# Patient Record
Sex: Female | Born: 1955 | Hispanic: No | Marital: Married | Smoking: Never smoker
Health system: Southern US, Community
[De-identification: ages and names within clinical notes are randomized; demographics above are authoritative.]

## PROBLEM LIST (undated history)

## (undated) DIAGNOSIS — J309 Allergic rhinitis, unspecified: Secondary | ICD-10-CM

## (undated) DIAGNOSIS — H16131 Photokeratitis, right eye: Secondary | ICD-10-CM

## (undated) DIAGNOSIS — E785 Hyperlipidemia, unspecified: Secondary | ICD-10-CM

## (undated) DIAGNOSIS — E669 Obesity, unspecified: Secondary | ICD-10-CM

## (undated) DIAGNOSIS — D126 Benign neoplasm of colon, unspecified: Secondary | ICD-10-CM

## (undated) DIAGNOSIS — Z6832 Body mass index (BMI) 32.0-32.9, adult: Secondary | ICD-10-CM

## (undated) DIAGNOSIS — M85851 Other specified disorders of bone density and structure, right thigh: Secondary | ICD-10-CM

## (undated) HISTORY — DX: Hyperlipidemia, unspecified: E78.5

## (undated) HISTORY — DX: Benign neoplasm of colon, unspecified: D12.6

## (undated) HISTORY — DX: Allergic rhinitis, unspecified: J30.9

## (undated) HISTORY — DX: Other specified disorders of bone density and structure, right thigh: M85.851

## (undated) HISTORY — PX: ABDOMINAL HYSTERECTOMY: SHX81

## (undated) HISTORY — DX: Obesity, unspecified: E66.9

## (undated) HISTORY — DX: Photokeratitis, right eye: H16.131

## (undated) HISTORY — DX: Body mass index (BMI) 32.0-32.9, adult: Z68.32

## (undated) HISTORY — PX: TONSILLECTOMY: SUR1361

---

## 1998-05-29 ENCOUNTER — Other Ambulatory Visit: Admission: RE | Admit: 1998-05-29 | Discharge: 1998-05-29 | Payer: Self-pay | Admitting: Gynecology

## 1999-06-13 ENCOUNTER — Other Ambulatory Visit: Admission: RE | Admit: 1999-06-13 | Discharge: 1999-06-13 | Payer: Self-pay | Admitting: Gynecology

## 2000-06-05 ENCOUNTER — Encounter: Payer: Self-pay | Admitting: Gynecology

## 2000-06-05 ENCOUNTER — Encounter: Admission: RE | Admit: 2000-06-05 | Discharge: 2000-06-05 | Payer: Self-pay | Admitting: Gynecology

## 2000-06-13 ENCOUNTER — Other Ambulatory Visit: Admission: RE | Admit: 2000-06-13 | Discharge: 2000-06-13 | Payer: Self-pay | Admitting: Gynecology

## 2001-06-08 ENCOUNTER — Encounter: Payer: Self-pay | Admitting: Gynecology

## 2001-06-08 ENCOUNTER — Encounter: Admission: RE | Admit: 2001-06-08 | Discharge: 2001-06-08 | Payer: Self-pay | Admitting: Gynecology

## 2001-07-06 ENCOUNTER — Other Ambulatory Visit: Admission: RE | Admit: 2001-07-06 | Discharge: 2001-07-06 | Payer: Self-pay | Admitting: Dermatology

## 2002-06-09 ENCOUNTER — Encounter: Admission: RE | Admit: 2002-06-09 | Discharge: 2002-06-09 | Payer: Self-pay | Admitting: Gynecology

## 2002-06-09 ENCOUNTER — Encounter: Payer: Self-pay | Admitting: Gynecology

## 2002-07-08 ENCOUNTER — Other Ambulatory Visit: Admission: RE | Admit: 2002-07-08 | Discharge: 2002-07-08 | Payer: Self-pay | Admitting: Gynecology

## 2003-07-06 ENCOUNTER — Encounter: Admission: RE | Admit: 2003-07-06 | Discharge: 2003-07-06 | Payer: Self-pay | Admitting: Gynecology

## 2003-07-06 ENCOUNTER — Encounter: Payer: Self-pay | Admitting: Gynecology

## 2003-07-13 ENCOUNTER — Other Ambulatory Visit: Admission: RE | Admit: 2003-07-13 | Discharge: 2003-07-13 | Payer: Self-pay | Admitting: Gynecology

## 2004-07-09 ENCOUNTER — Ambulatory Visit (HOSPITAL_COMMUNITY): Admission: RE | Admit: 2004-07-09 | Discharge: 2004-07-09 | Payer: Self-pay | Admitting: Gynecology

## 2004-07-16 ENCOUNTER — Other Ambulatory Visit: Admission: RE | Admit: 2004-07-16 | Discharge: 2004-07-16 | Payer: Self-pay | Admitting: Gynecology

## 2005-07-12 ENCOUNTER — Ambulatory Visit (HOSPITAL_COMMUNITY): Admission: RE | Admit: 2005-07-12 | Discharge: 2005-07-12 | Payer: Self-pay | Admitting: Gynecology

## 2005-07-18 ENCOUNTER — Other Ambulatory Visit: Admission: RE | Admit: 2005-07-18 | Discharge: 2005-07-18 | Payer: Self-pay | Admitting: Gynecology

## 2005-07-25 ENCOUNTER — Encounter: Admission: RE | Admit: 2005-07-25 | Discharge: 2005-07-25 | Payer: Self-pay | Admitting: Gynecology

## 2006-07-21 ENCOUNTER — Other Ambulatory Visit: Admission: RE | Admit: 2006-07-21 | Discharge: 2006-07-21 | Payer: Self-pay | Admitting: Gynecology

## 2006-07-28 ENCOUNTER — Ambulatory Visit (HOSPITAL_COMMUNITY): Admission: RE | Admit: 2006-07-28 | Discharge: 2006-07-28 | Payer: Self-pay | Admitting: Gynecology

## 2007-09-24 ENCOUNTER — Ambulatory Visit (HOSPITAL_COMMUNITY): Admission: RE | Admit: 2007-09-24 | Discharge: 2007-09-24 | Payer: Self-pay | Admitting: Gynecology

## 2008-11-15 ENCOUNTER — Ambulatory Visit (HOSPITAL_COMMUNITY): Admission: RE | Admit: 2008-11-15 | Discharge: 2008-11-15 | Payer: Self-pay | Admitting: Gynecology

## 2009-12-12 ENCOUNTER — Ambulatory Visit (HOSPITAL_COMMUNITY): Admission: RE | Admit: 2009-12-12 | Discharge: 2009-12-12 | Payer: Self-pay | Admitting: Gynecology

## 2010-10-06 ENCOUNTER — Encounter: Payer: Self-pay | Admitting: Gynecology

## 2011-01-04 ENCOUNTER — Other Ambulatory Visit (HOSPITAL_COMMUNITY): Payer: Self-pay | Admitting: Gynecology

## 2011-01-04 DIAGNOSIS — Z1231 Encounter for screening mammogram for malignant neoplasm of breast: Secondary | ICD-10-CM

## 2011-01-10 ENCOUNTER — Ambulatory Visit (HOSPITAL_COMMUNITY)
Admission: RE | Admit: 2011-01-10 | Discharge: 2011-01-10 | Disposition: A | Discharge status: Home / Self Care | Payer: BC Managed Care – PPO | Source: Ambulatory Visit | Attending: Gynecology | Admitting: Gynecology

## 2011-01-10 DIAGNOSIS — Z1231 Encounter for screening mammogram for malignant neoplasm of breast: Secondary | ICD-10-CM | POA: Insufficient documentation

## 2012-01-16 ENCOUNTER — Other Ambulatory Visit (HOSPITAL_COMMUNITY): Payer: Self-pay | Admitting: Gynecology

## 2012-01-16 DIAGNOSIS — Z1231 Encounter for screening mammogram for malignant neoplasm of breast: Secondary | ICD-10-CM

## 2012-02-04 ENCOUNTER — Ambulatory Visit (HOSPITAL_COMMUNITY)
Admission: RE | Admit: 2012-02-04 | Discharge: 2012-02-04 | Disposition: A | Discharge status: Home / Self Care | Payer: BC Managed Care – PPO | Source: Ambulatory Visit | Attending: Gynecology | Admitting: Gynecology

## 2012-02-04 DIAGNOSIS — Z1231 Encounter for screening mammogram for malignant neoplasm of breast: Secondary | ICD-10-CM | POA: Insufficient documentation

## 2013-01-28 ENCOUNTER — Other Ambulatory Visit (HOSPITAL_COMMUNITY): Payer: Self-pay | Admitting: Gynecology

## 2013-01-28 DIAGNOSIS — Z1231 Encounter for screening mammogram for malignant neoplasm of breast: Secondary | ICD-10-CM

## 2013-02-09 ENCOUNTER — Ambulatory Visit (HOSPITAL_COMMUNITY): Payer: BC Managed Care – PPO

## 2013-02-10 ENCOUNTER — Other Ambulatory Visit (HOSPITAL_COMMUNITY): Payer: Self-pay | Admitting: Family Medicine

## 2013-02-10 DIAGNOSIS — Z1231 Encounter for screening mammogram for malignant neoplasm of breast: Secondary | ICD-10-CM

## 2013-02-16 ENCOUNTER — Ambulatory Visit (HOSPITAL_COMMUNITY)
Admission: RE | Admit: 2013-02-16 | Discharge: 2013-02-16 | Disposition: A | Discharge status: Home / Self Care | Payer: BC Managed Care – PPO | Source: Ambulatory Visit | Attending: Family Medicine | Admitting: Family Medicine

## 2013-02-16 DIAGNOSIS — Z1231 Encounter for screening mammogram for malignant neoplasm of breast: Secondary | ICD-10-CM | POA: Insufficient documentation

## 2014-01-24 ENCOUNTER — Other Ambulatory Visit (HOSPITAL_COMMUNITY): Payer: Self-pay | Admitting: Family Medicine

## 2014-01-24 DIAGNOSIS — Z1231 Encounter for screening mammogram for malignant neoplasm of breast: Secondary | ICD-10-CM

## 2014-02-22 ENCOUNTER — Ambulatory Visit (HOSPITAL_COMMUNITY)
Admission: RE | Admit: 2014-02-22 | Discharge: 2014-02-22 | Disposition: A | Discharge status: Home / Self Care | Payer: BC Managed Care – PPO | Source: Ambulatory Visit | Attending: Family Medicine | Admitting: Family Medicine

## 2014-02-22 DIAGNOSIS — Z1231 Encounter for screening mammogram for malignant neoplasm of breast: Secondary | ICD-10-CM | POA: Insufficient documentation

## 2015-02-27 ENCOUNTER — Other Ambulatory Visit (HOSPITAL_COMMUNITY): Payer: Self-pay | Admitting: Family Medicine

## 2015-02-27 DIAGNOSIS — Z1231 Encounter for screening mammogram for malignant neoplasm of breast: Secondary | ICD-10-CM

## 2015-03-09 ENCOUNTER — Ambulatory Visit (HOSPITAL_COMMUNITY)
Admission: RE | Admit: 2015-03-09 | Discharge: 2015-03-09 | Disposition: A | Discharge status: Home / Self Care | Payer: PRIVATE HEALTH INSURANCE | Source: Ambulatory Visit | Attending: Family Medicine | Admitting: Family Medicine

## 2015-03-09 DIAGNOSIS — Z1231 Encounter for screening mammogram for malignant neoplasm of breast: Secondary | ICD-10-CM

## 2016-04-24 ENCOUNTER — Other Ambulatory Visit: Payer: Self-pay | Admitting: Family Medicine

## 2016-04-24 DIAGNOSIS — Z1231 Encounter for screening mammogram for malignant neoplasm of breast: Secondary | ICD-10-CM

## 2016-05-01 ENCOUNTER — Ambulatory Visit
Admission: RE | Admit: 2016-05-01 | Discharge: 2016-05-01 | Disposition: A | Discharge status: Home / Self Care | Payer: PRIVATE HEALTH INSURANCE | Source: Ambulatory Visit | Attending: Family Medicine | Admitting: Family Medicine

## 2016-05-01 DIAGNOSIS — Z1231 Encounter for screening mammogram for malignant neoplasm of breast: Secondary | ICD-10-CM

## 2017-05-06 ENCOUNTER — Other Ambulatory Visit: Payer: Self-pay | Admitting: Family Medicine

## 2017-05-06 DIAGNOSIS — Z1231 Encounter for screening mammogram for malignant neoplasm of breast: Secondary | ICD-10-CM

## 2017-05-16 ENCOUNTER — Ambulatory Visit
Admission: RE | Admit: 2017-05-16 | Discharge: 2017-05-16 | Disposition: A | Discharge status: Home / Self Care | Payer: PRIVATE HEALTH INSURANCE | Source: Ambulatory Visit | Attending: Family Medicine | Admitting: Family Medicine

## 2017-05-16 DIAGNOSIS — Z1231 Encounter for screening mammogram for malignant neoplasm of breast: Secondary | ICD-10-CM

## 2017-11-14 ENCOUNTER — Emergency Department (HOSPITAL_BASED_OUTPATIENT_CLINIC_OR_DEPARTMENT_OTHER)
Admission: EM | Admit: 2017-11-14 | Discharge: 2017-11-15 | Disposition: A | Discharge status: Home / Self Care | Payer: PRIVATE HEALTH INSURANCE | Attending: Emergency Medicine | Admitting: Emergency Medicine

## 2017-11-14 ENCOUNTER — Other Ambulatory Visit: Payer: Self-pay

## 2017-11-14 ENCOUNTER — Encounter (HOSPITAL_BASED_OUTPATIENT_CLINIC_OR_DEPARTMENT_OTHER): Payer: Self-pay | Admitting: Emergency Medicine

## 2017-11-14 DIAGNOSIS — R309 Painful micturition, unspecified: Secondary | ICD-10-CM | POA: Diagnosis not present

## 2017-11-14 DIAGNOSIS — R300 Dysuria: Secondary | ICD-10-CM | POA: Diagnosis not present

## 2017-11-14 LAB — URINALYSIS, MICROSCOPIC (REFLEX): WBC, UA: NONE SEEN WBC/hpf (ref 0–5)

## 2017-11-14 LAB — URINALYSIS, ROUTINE W REFLEX MICROSCOPIC
Bilirubin Urine: NEGATIVE
GLUCOSE, UA: NEGATIVE mg/dL
KETONES UR: NEGATIVE mg/dL
LEUKOCYTES UA: NEGATIVE
Nitrite: NEGATIVE
PH: 6 (ref 5.0–8.0)
Protein, ur: NEGATIVE mg/dL
Specific Gravity, Urine: 1.01 (ref 1.005–1.030)

## 2017-11-14 NOTE — ED Triage Notes (Signed)
Pt presents with c/o lower abdominal pain that started today

## 2017-11-14 NOTE — ED Provider Notes (Signed)
TIME SEEN: 11:40 PM  CHIEF COMPLAINT: Dysuria  HPI: Patient is a 62 year old female with previous history of urinary tract infection presents to the emergency department today with 1 day of dysuria and feeling a "pulling pain" when she is urinating.  No gross hematuria.  No flank pain.  No fevers, nausea, vomiting or diarrhea.  States this feels similar to her previous urinary tract infections.  She denies any vaginal bleeding or discharge.  ROS: See HPI Constitutional: no fever  Eyes: no drainage  ENT: no runny nose   Cardiovascular:  no chest pain  Resp: no SOB  GI: no vomiting GU:  dysuria Integumentary: no rash  Allergy: no hives  Musculoskeletal: no leg swelling  Neurological: no slurred speech ROS otherwise negative  PAST MEDICAL HISTORY/PAST SURGICAL HISTORY:  History reviewed. No pertinent past medical history.  MEDICATIONS:  Prior to Admission medications   Not on File    ALLERGIES:  No Known Allergies  SOCIAL HISTORY:  Social History   Tobacco Use  . Smoking status: Never Smoker  . Smokeless tobacco: Never Used  Substance Use Topics  . Alcohol use: No    Frequency: Never    FAMILY HISTORY: Family History  Problem Relation Age of Onset  . Breast cancer Sister [id number]    EXAM: BP 134/78 (BP Location: Left Arm)   Pulse 81   Temp 98 F (36.7 C) (Oral)   Resp 20   Ht 5\' 1"  (1.549 m)   Wt 60.8 kg (134 lb)   SpO2 99%   BMI 25.32 kg/m  CONSTITUTIONAL: Alert and oriented and responds appropriately to questions. Well-appearing; well-nourished HEAD: Normocephalic EYES: Conjunctivae clear, pupils appear equal, EOMI ENT: normal nose; moist mucous membranes NECK: Supple, no meningismus, no nuchal rigidity, no LAD  CARD: RRR; S1 and S2 appreciated; no murmurs, no clicks, no rubs, no gallops RESP: Normal chest excursion without splinting or tachypnea; breath sounds clear and equal bilaterally; no wheezes, no rhonchi, no rales, no hypoxia or respiratory distress,  speaking full sentences ABD/GI: Normal bowel sounds; non-distended; soft, non-tender, no rebound, no guarding, no peritoneal signs, no hepatosplenomegaly, no tenderness at McBurney's point BACK:  The back appears normal and is non-tender to palpation, there is no CVA tenderness EXT: Normal ROM in all joints; non-tender to palpation; no edema; normal capillary refill; no cyanosis, no calf tenderness or swelling    SKIN: Normal color for age and race; warm; no rash NEURO: Moves all extremities equally PSYCH: The patient's mood and manner are appropriate. Grooming and personal hygiene are appropriate.  MEDICAL DECISION MAKING: Patient here with symptoms of urinary tract infection.  Urinalysis however is reassuring.  Abdominal exam is benign.  No flank pain.  She is well-appearing, nontoxic, well-hydrated.  We have sent a urine culture.  She denies any vaginal bleeding or discharge.  She states this feels similar to her previous urinary tract infections.  Patient is comfortable with waiting for urine culture to come back and not starting antibiotics at this time.  She is comfortable with increasing fluid intake, alternating Tylenol and ibuprofen.  Will discharge with prescription for Pyridium to help with discomfort.  We discussed at length return precautions including signs or symptoms of appendicitis or pelvic infection.  Doubt appendicitis currently, PID, TOA, ovarian torsion, colitis, bowel obstruction, diverticulitis based on benign exam.  Patient verbalized understanding and is comfortable with this plan.  She has a PCP for outpatient follow-up.  At this time, I do not feel there is any  life-threatening condition present. I have reviewed and discussed all results (EKG, imaging, lab, urine as appropriate) and exam findings with patient/family. I have reviewed nursing notes and appropriate previous records.  I feel the patient is safe to be discharged home without further emergent workup and can continue  workup as an outpatient as needed. Discussed usual and customary return precautions. Patient/family verbalize understanding and are comfortable with this plan.  Outpatient follow-up has been provided if needed. All questions have been answered.      Sumi Lye, Layla Maw, DO 11/15/17 [phone]

## 2017-11-15 MED ORDER — PHENAZOPYRIDINE HCL 100 MG PO TABS
95.0000 mg | ORAL_TABLET | Freq: Once | ORAL | Status: AC
  Administered 2017-11-15: 100 mg via ORAL
  Filled 2017-11-15: qty 1

## 2017-11-15 MED ORDER — PHENAZOPYRIDINE HCL 95 MG PO TABS: 95.0000 mg | ORAL_TABLET | Freq: Three times a day (TID) | ORAL | 0 refills | Status: DC | PRN

## 2017-11-15 NOTE — Discharge Instructions (Signed)
You may alternate Tylenol 1000 mg every 6 hours as needed for pain and Ibuprofen 800 mg every 8 hours as needed for pain.  Please take Ibuprofen with food.   I recommend that you increase your water intake.  Please drink 60-80 ounces of water a day.  Your urinalysis was reassuring today.  We have sent a urine culture.  If this grows bacteria concerning for a urinary tract infection, you will be contacted and started on antibiotics.

## 2017-11-16 LAB — URINE CULTURE: CULTURE: NO GROWTH

## 2018-06-19 ENCOUNTER — Other Ambulatory Visit: Payer: Self-pay | Admitting: Family Medicine

## 2018-06-19 DIAGNOSIS — Z1231 Encounter for screening mammogram for malignant neoplasm of breast: Secondary | ICD-10-CM

## 2018-07-22 ENCOUNTER — Ambulatory Visit
Admission: RE | Admit: 2018-07-22 | Discharge: 2018-07-22 | Disposition: A | Discharge status: Home / Self Care | Payer: PRIVATE HEALTH INSURANCE | Source: Ambulatory Visit | Attending: Family Medicine | Admitting: Family Medicine

## 2018-07-22 DIAGNOSIS — Z1231 Encounter for screening mammogram for malignant neoplasm of breast: Secondary | ICD-10-CM

## 2019-06-14 ENCOUNTER — Other Ambulatory Visit: Payer: Self-pay | Admitting: Family Medicine

## 2019-06-14 DIAGNOSIS — Z1231 Encounter for screening mammogram for malignant neoplasm of breast: Secondary | ICD-10-CM

## 2019-07-29 ENCOUNTER — Other Ambulatory Visit: Payer: Self-pay

## 2019-07-29 ENCOUNTER — Ambulatory Visit
Admission: RE | Admit: 2019-07-29 | Discharge: 2019-07-29 | Disposition: A | Discharge status: Home / Self Care | Payer: PRIVATE HEALTH INSURANCE | Source: Ambulatory Visit | Attending: Family Medicine | Admitting: Family Medicine

## 2019-07-29 DIAGNOSIS — Z1231 Encounter for screening mammogram for malignant neoplasm of breast: Secondary | ICD-10-CM

## 2020-07-05 ENCOUNTER — Other Ambulatory Visit: Payer: Self-pay | Admitting: Family Medicine

## 2020-07-05 DIAGNOSIS — Z1231 Encounter for screening mammogram for malignant neoplasm of breast: Secondary | ICD-10-CM

## 2020-08-02 ENCOUNTER — Other Ambulatory Visit: Payer: Self-pay

## 2020-08-02 ENCOUNTER — Ambulatory Visit
Admission: RE | Admit: 2020-08-02 | Discharge: 2020-08-02 | Disposition: A | Discharge status: Home / Self Care | Payer: PRIVATE HEALTH INSURANCE | Source: Ambulatory Visit | Attending: Family Medicine | Admitting: Family Medicine

## 2020-08-02 DIAGNOSIS — Z1231 Encounter for screening mammogram for malignant neoplasm of breast: Secondary | ICD-10-CM

## 2021-07-11 ENCOUNTER — Other Ambulatory Visit: Payer: Self-pay | Admitting: Family Medicine

## 2021-07-11 DIAGNOSIS — Z1231 Encounter for screening mammogram for malignant neoplasm of breast: Secondary | ICD-10-CM

## 2021-08-08 ENCOUNTER — Ambulatory Visit
Admission: RE | Admit: 2021-08-08 | Discharge: 2021-08-08 | Disposition: A | Discharge status: Home / Self Care | Payer: No Typology Code available for payment source | Source: Ambulatory Visit | Attending: Family Medicine | Admitting: Family Medicine

## 2021-08-08 DIAGNOSIS — Z1231 Encounter for screening mammogram for malignant neoplasm of breast: Secondary | ICD-10-CM

## 2022-07-17 ENCOUNTER — Other Ambulatory Visit: Payer: Self-pay | Admitting: Family Medicine

## 2022-07-17 DIAGNOSIS — Z1231 Encounter for screening mammogram for malignant neoplasm of breast: Secondary | ICD-10-CM

## 2022-08-14 ENCOUNTER — Encounter: Payer: Self-pay | Admitting: Family Medicine

## 2022-08-15 ENCOUNTER — Other Ambulatory Visit: Payer: Self-pay | Admitting: Family Medicine

## 2022-08-15 DIAGNOSIS — E2839 Other primary ovarian failure: Secondary | ICD-10-CM

## 2022-09-12 ENCOUNTER — Ambulatory Visit
Admission: RE | Admit: 2022-09-12 | Discharge: 2022-09-12 | Disposition: A | Discharge status: Home / Self Care | Payer: PRIVATE HEALTH INSURANCE | Source: Ambulatory Visit | Attending: Family Medicine | Admitting: Family Medicine

## 2022-09-12 ENCOUNTER — Ambulatory Visit: Payer: PRIVATE HEALTH INSURANCE

## 2022-09-12 DIAGNOSIS — Z1231 Encounter for screening mammogram for malignant neoplasm of breast: Secondary | ICD-10-CM

## 2022-11-15 DIAGNOSIS — E559 Vitamin D deficiency, unspecified: Secondary | ICD-10-CM | POA: Diagnosis not present

## 2023-01-30 ENCOUNTER — Ambulatory Visit
Admission: RE | Admit: 2023-01-30 | Discharge: 2023-01-30 | Disposition: A | Discharge status: Home / Self Care | Payer: PPO | Source: Ambulatory Visit | Attending: Family Medicine | Admitting: Family Medicine

## 2023-01-30 DIAGNOSIS — E2839 Other primary ovarian failure: Secondary | ICD-10-CM

## 2023-01-30 DIAGNOSIS — Z13820 Encounter for screening for osteoporosis: Secondary | ICD-10-CM | POA: Diagnosis not present

## 2023-06-04 DIAGNOSIS — L821 Other seborrheic keratosis: Secondary | ICD-10-CM | POA: Diagnosis not present

## 2023-06-04 DIAGNOSIS — D225 Melanocytic nevi of trunk: Secondary | ICD-10-CM | POA: Diagnosis not present

## 2023-06-04 DIAGNOSIS — L820 Inflamed seborrheic keratosis: Secondary | ICD-10-CM | POA: Diagnosis not present

## 2023-06-04 DIAGNOSIS — L578 Other skin changes due to chronic exposure to nonionizing radiation: Secondary | ICD-10-CM | POA: Diagnosis not present

## 2023-06-04 DIAGNOSIS — L814 Other melanin hyperpigmentation: Secondary | ICD-10-CM | POA: Diagnosis not present

## 2023-07-20 IMAGING — MG MM DIGITAL SCREENING BILAT W/ TOMO AND CAD
8 series · 8 of 24 positions shown · non-contrast
Comparison: Previous exam(s).

CLINICAL DATA: Screening.

EXAM:
DIGITAL SCREENING BILATERAL MAMMOGRAM WITH TOMOSYNTHESIS AND CAD
TECHNIQUE: Bilateral screening digital craniocaudal and mediolateral oblique
mammograms were obtained. Bilateral screening digital breast
tomosynthesis was performed. The images were evaluated with
computer-aided detection.

[R MLO synth-2D]
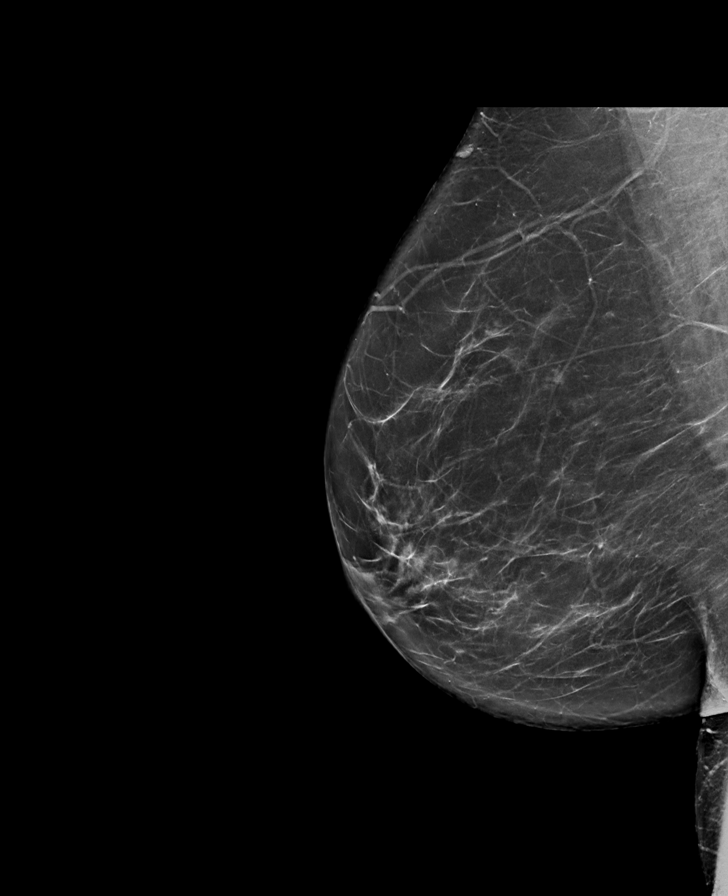

[L MLO synth-2D]
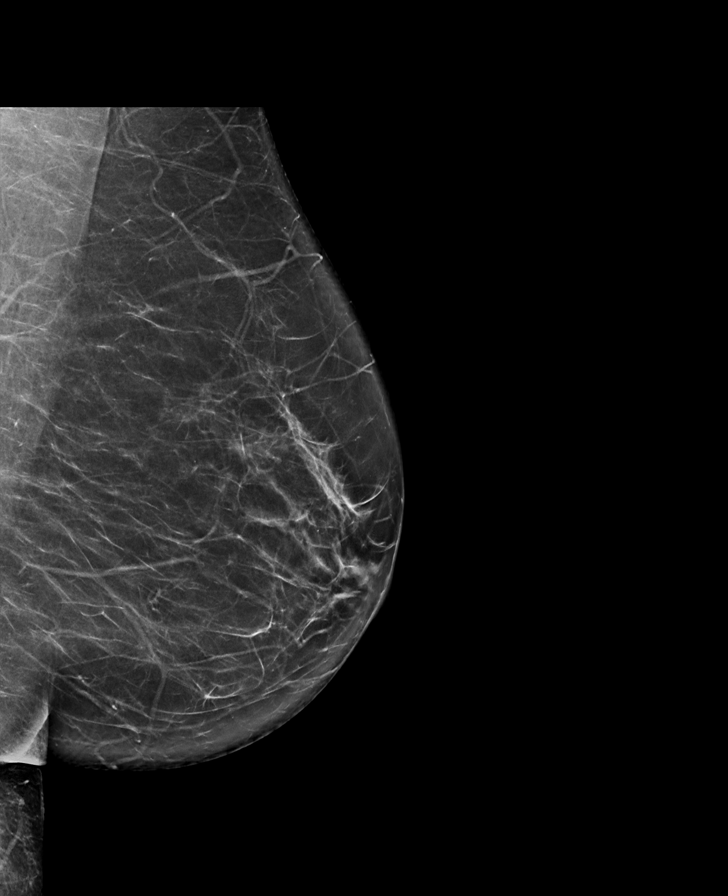

[L CC synth-2D]
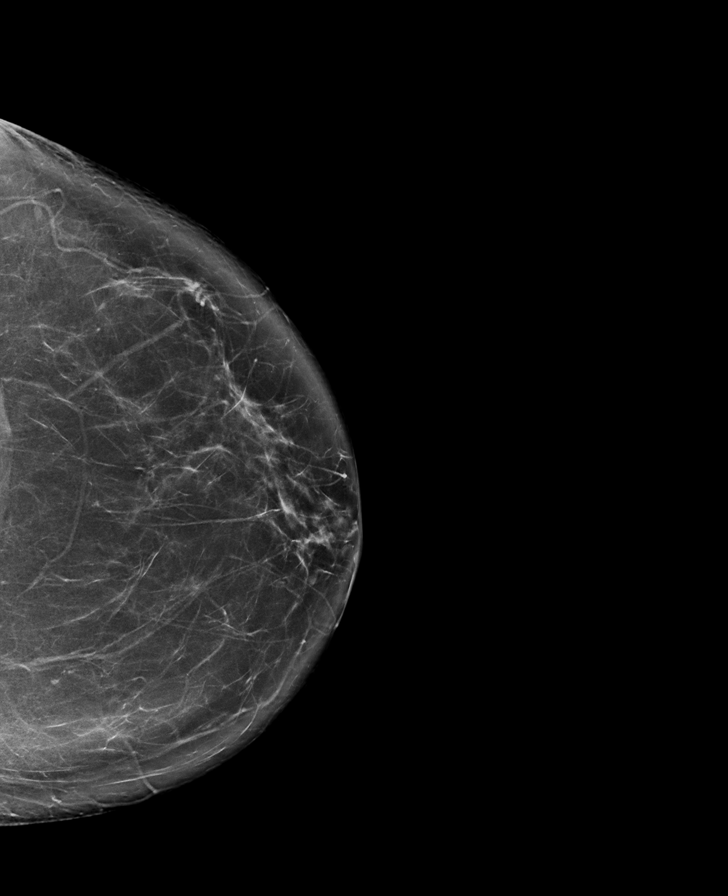

[R CC synth-2D]
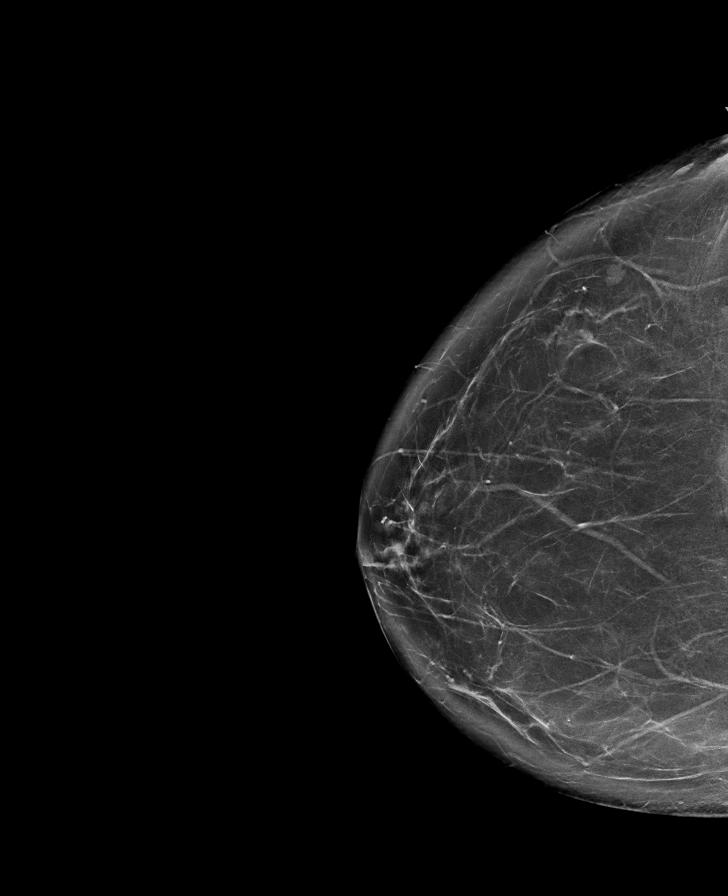

[R CC tomo · tomo slice 45/88.0]
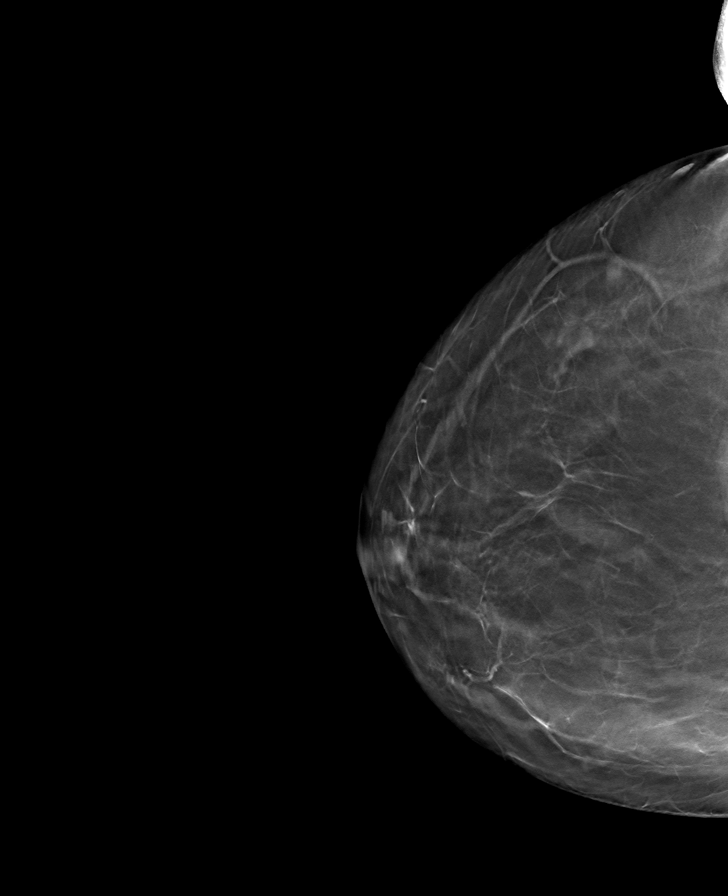

[L CC tomo · tomo slice 43/86.0]
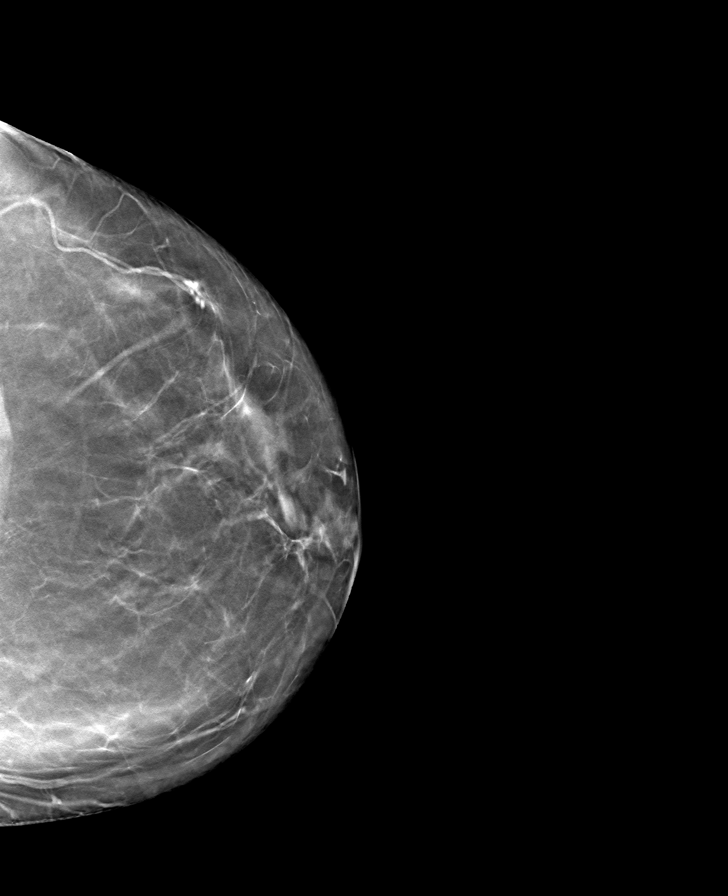

[L MLO tomo · tomo slice 41/81.0]
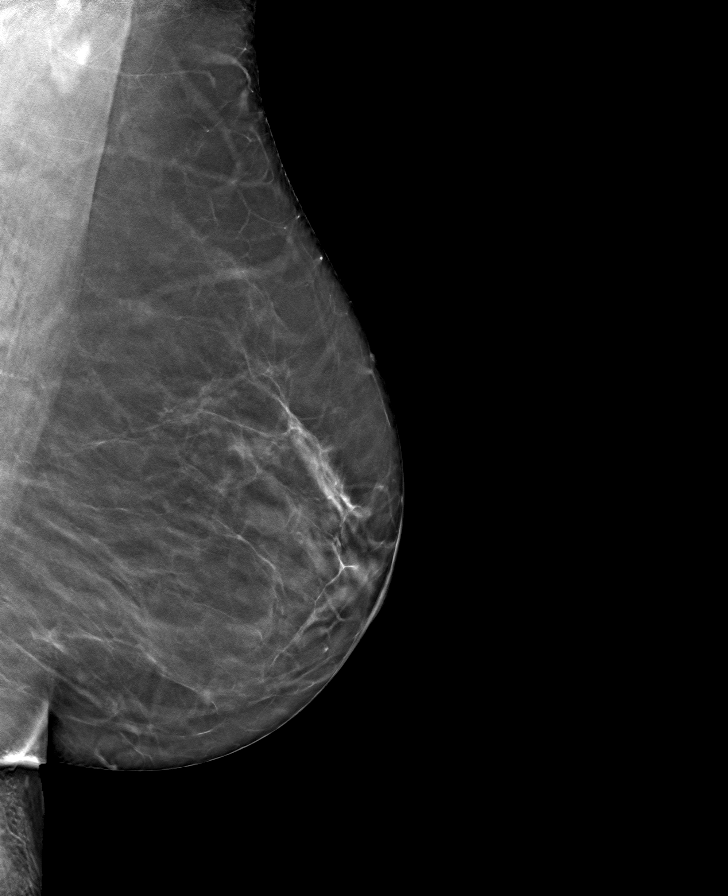

[R MLO tomo · tomo slice 41/81.0]
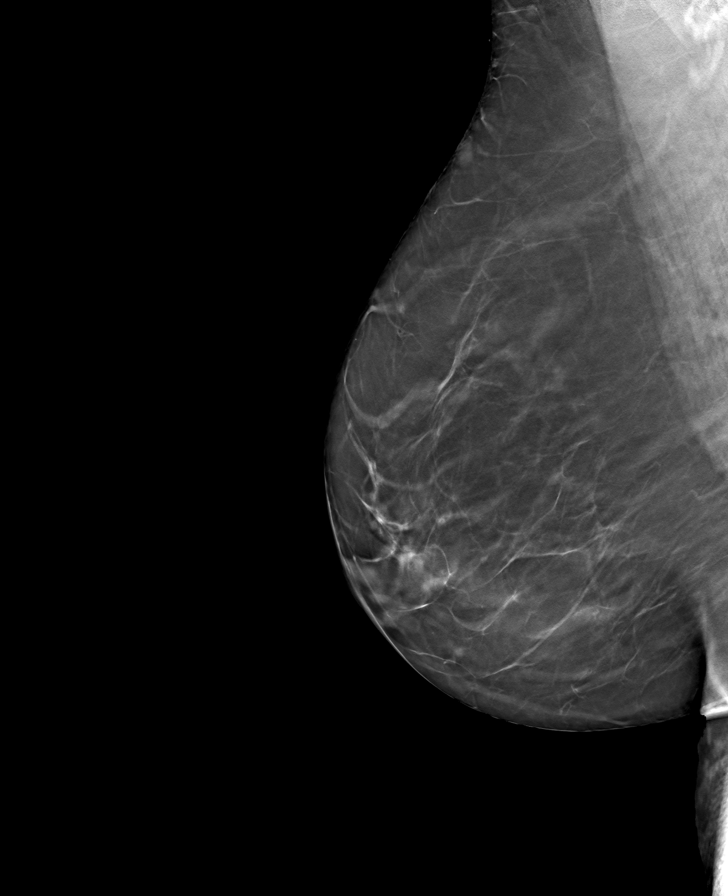

[8 of 24 positions shown; findings below may reference images not displayed]

ACR Breast Density Category b: There are scattered areas of
fibroglandular density.
FINDINGS: There are no findings suspicious for malignancy.
IMPRESSION: No mammographic evidence of malignancy. A result letter of this
screening mammogram will be mailed directly to the patient.

RECOMMENDATION:
Screening mammogram in one year. (Code:51-O-LD2)

BI-RADS CATEGORY  1: Negative.

## 2023-07-31 ENCOUNTER — Other Ambulatory Visit: Payer: Self-pay | Admitting: Family Medicine

## 2023-07-31 DIAGNOSIS — Z1231 Encounter for screening mammogram for malignant neoplasm of breast: Secondary | ICD-10-CM

## 2023-08-11 DIAGNOSIS — E7800 Pure hypercholesterolemia, unspecified: Secondary | ICD-10-CM | POA: Diagnosis not present

## 2023-08-11 DIAGNOSIS — E559 Vitamin D deficiency, unspecified: Secondary | ICD-10-CM | POA: Diagnosis not present

## 2023-08-18 DIAGNOSIS — Z6832 Body mass index (BMI) 32.0-32.9, adult: Secondary | ICD-10-CM | POA: Diagnosis not present

## 2023-08-18 DIAGNOSIS — Z131 Encounter for screening for diabetes mellitus: Secondary | ICD-10-CM | POA: Diagnosis not present

## 2023-08-18 DIAGNOSIS — E7800 Pure hypercholesterolemia, unspecified: Secondary | ICD-10-CM | POA: Diagnosis not present

## 2023-08-18 DIAGNOSIS — M85859 Other specified disorders of bone density and structure, unspecified thigh: Secondary | ICD-10-CM | POA: Diagnosis not present

## 2023-08-18 DIAGNOSIS — Z0000 Encounter for general adult medical examination without abnormal findings: Secondary | ICD-10-CM | POA: Diagnosis not present

## 2023-08-18 DIAGNOSIS — E66811 Obesity, class 1: Secondary | ICD-10-CM | POA: Diagnosis not present

## 2023-08-25 ENCOUNTER — Other Ambulatory Visit (HOSPITAL_BASED_OUTPATIENT_CLINIC_OR_DEPARTMENT_OTHER): Payer: Self-pay | Admitting: Family Medicine

## 2023-08-25 DIAGNOSIS — E7800 Pure hypercholesterolemia, unspecified: Secondary | ICD-10-CM

## 2023-09-01 ENCOUNTER — Ambulatory Visit (HOSPITAL_BASED_OUTPATIENT_CLINIC_OR_DEPARTMENT_OTHER)
Admission: RE | Admit: 2023-09-01 | Discharge: 2023-09-01 | Disposition: A | Discharge status: Home / Self Care | Payer: Self-pay | Source: Ambulatory Visit | Attending: Family Medicine | Admitting: Family Medicine

## 2023-09-01 ENCOUNTER — Other Ambulatory Visit (HOSPITAL_BASED_OUTPATIENT_CLINIC_OR_DEPARTMENT_OTHER): Payer: Self-pay | Admitting: Family Medicine

## 2023-09-01 DIAGNOSIS — Z1231 Encounter for screening mammogram for malignant neoplasm of breast: Secondary | ICD-10-CM

## 2023-09-01 DIAGNOSIS — E7800 Pure hypercholesterolemia, unspecified: Secondary | ICD-10-CM | POA: Insufficient documentation

## 2023-09-15 ENCOUNTER — Encounter (HOSPITAL_BASED_OUTPATIENT_CLINIC_OR_DEPARTMENT_OTHER): Payer: Self-pay

## 2023-09-15 ENCOUNTER — Ambulatory Visit: Payer: PPO

## 2023-09-15 ENCOUNTER — Ambulatory Visit (HOSPITAL_BASED_OUTPATIENT_CLINIC_OR_DEPARTMENT_OTHER)
Admission: RE | Admit: 2023-09-15 | Discharge: 2023-09-15 | Disposition: A | Discharge status: Home / Self Care | Payer: PPO | Source: Ambulatory Visit | Attending: Family Medicine | Admitting: Family Medicine

## 2023-09-15 DIAGNOSIS — Z1231 Encounter for screening mammogram for malignant neoplasm of breast: Secondary | ICD-10-CM | POA: Diagnosis not present

## 2023-11-14 DIAGNOSIS — M545 Low back pain, unspecified: Secondary | ICD-10-CM | POA: Diagnosis not present

## 2023-11-20 DIAGNOSIS — E7800 Pure hypercholesterolemia, unspecified: Secondary | ICD-10-CM | POA: Diagnosis not present

## 2023-11-25 ENCOUNTER — Encounter: Payer: Self-pay | Admitting: Cardiology

## 2023-11-25 ENCOUNTER — Ambulatory Visit: Payer: PPO | Attending: Cardiology | Admitting: Cardiology

## 2023-11-25 VITALS — BP 136/94 | HR 96 | Resp 16 | Ht 61.0 in | Wt 174.6 lb

## 2023-11-25 DIAGNOSIS — R931 Abnormal findings on diagnostic imaging of heart and coronary circulation: Secondary | ICD-10-CM | POA: Diagnosis not present

## 2023-11-25 DIAGNOSIS — E7800 Pure hypercholesterolemia, unspecified: Secondary | ICD-10-CM | POA: Diagnosis not present

## 2023-11-25 DIAGNOSIS — R030 Elevated blood-pressure reading, without diagnosis of hypertension: Secondary | ICD-10-CM | POA: Diagnosis not present

## 2023-11-25 NOTE — Progress Notes (Signed)
 Cardiology Office Note:  .   Date:  11/25/2023  ID:  Sarah Gray, DOB Jun 25, 1956, MRN [id number] PCP: Sarah Dimitri, MD  [facility] Providers Cardiologist:  Yates Decamp, MD   History of Present Illness: .   Sarah Gray is a 68 y.o.  active Caucasian female patient with osteopenia, hypercholesterolemia, elevated coronary calcium score in the 88 percentile, all the coronary calcium was located in the LAD proximal and mid segment.  She was started on Lipitor 40 mg daily and in view of abnormal coronary CT calcium score, was referred for further cardiac risk stratification.  Discussed the use of AI scribe software for clinical note transcription with the patient, who gave verbal consent to proceed.  History of Present Illness   The patient, a 68 year old with a history of high cholesterol, presents for a routine check-up. She reports feeling well and denies any symptoms such as shortness of breath or chest pain. She has been taking atorvastatin for her cholesterol since the end of December and has noticed a significant improvement in her cholesterol levels. However, her LDL level was still slightly above the target range, prompting an increase in her atorvastatin dosage. The patient is physically active, walking regularly for exercise and maintaining an active lifestyle. She does not smoke or drink alcohol. During the visit, her blood pressure was found to be elevated, a new finding for this patient.      Labs   External Labs:  PCP Labs 08/11/2023:  Total cholesterol 243, triglycerides 215, HDL 60, LDL 144.  Serum glucose 80 mg, BUN 13, creatinine 0.85, EGFR 75 mL, potassium 4.0, LFTs normal.  Vitamin D 47.9.  Review of Systems  Cardiovascular:  Negative for chest pain, dyspnea on exertion and leg swelling.   Physical Exam:   VS:  BP (!) 136/94 (BP Location: Left Arm, Patient Position: Sitting, Cuff Size: Normal)   Pulse 96   Resp 16   Ht 5\' 1"  (1.549 m)   Wt  174 lb 9.6 oz (79.2 kg)   SpO2 96%   BMI 32.99 kg/m    Wt Readings from Last 3 Encounters:  11/25/23 174 lb 9.6 oz (79.2 kg)  11/14/17 134 lb (60.8 kg)    Physical Exam Constitutional:      Appearance: She is obese.  Neck:     Vascular: No carotid bruit or JVD.  Cardiovascular:     Rate and Rhythm: Normal rate and regular rhythm.     Pulses: Intact distal pulses.     Heart sounds: Normal heart sounds. No murmur heard.    No gallop.  Pulmonary:     Effort: Pulmonary effort is normal.     Breath sounds: Normal breath sounds.  Abdominal:     General: Bowel sounds are normal.     Palpations: Abdomen is soft.  Musculoskeletal:     Right lower leg: No edema.     Left lower leg: No edema.    Studies Reviewed: .    CT CARDIAC SCORING (SELF PAY ONLY) 09/01/2023: Left main: 0  Left anterior descending artery: 219 - proximal and mid LAD  Left circumflex artery: 0 Right coronary artery: 0  Total: 219. MESA database Percentile: 88  EKG:    EKG Interpretation Date/Time:  Tuesday November 25 2023 15:36:20 EDT Ventricular Rate:  90 PR Interval:  164 QRS Duration:  76 QT Interval:  360 QTC Calculation: 440 R Axis:   1  Text Interpretation: EKG 11/25/2023: Normal sinus rhythm at  rate of 90 bpm, normal EKG.  No prior EKGs to compare. Confirmed by Delrae Rend [phone]) on 11/25/2023 3:53:41 PM    Medications and allergies    Allergies  Allergen Reactions   Decongestant [Pseudoephedrine Hcl]     PALPITATIONS   Zyrtec [Cetirizine]     HALLICINATIONS    Current Outpatient Medications:    aspirin EC 81 MG tablet, Take 81 mg by mouth daily. Swallow whole., Disp: , Rfl:    atorvastatin (LIPITOR) 40 MG tablet, Take 40 mg by mouth daily., Disp: , Rfl:    Multiple Vitamin (MULTIVITAMIN) capsule, Take 1 capsule by mouth daily., Disp: , Rfl:    vitamin D3 (CHOLECALCIFEROL) 25 MCG tablet, Take 1,000 Units by mouth daily., Disp: , Rfl:    ASSESSMENT AND PLAN: .      ICD-10-CM   1.  Elevated coronary artery calcium score 09/01/2023: Total: 219. MESA database Percentile: 88  R93.1 EKG 12-Lead    EXERCISE TOLERANCE TEST (ETT)    Cardiac Stress Test: Informed Consent Details: Physician/Practitioner Attestation; Transcribe to consent form and obtain patient signature    2. Pure hypercholesterolemia  E78.00 EXERCISE TOLERANCE TEST (ETT)    3. Elevated BP without diagnosis of hypertension  R03.0 EXERCISE TOLERANCE TEST (ETT)    Cardiac Stress Test: Informed Consent Details: Physician/Practitioner Attestation; Transcribe to consent form and obtain patient signature      Assessment and Plan    Elevated coronary calcium score   Her coronary calcium score is in the 88th percentile, indicating significant calcification in the LAD artery and a high burden of atherosclerotic plaque, increasing cardiovascular event risk. Atorvastatin 40 mg daily was initiated in December to reduce LDL levels and stabilize plaque, with a target LDL below 70 mg/dL to minimize plaque rupture risk. Recent labs showed LDL at 59 mg/dL, prompting an atorvastatin dose increase. Arterial calcium indicates significant plaque, and the goal is to shrink plaque to prevent myocardial infarction. Statins will help dissolve fat, and while calcium levels may rise as foam cells die, soft plaque rupture risk will decrease. Continue atorvastatin 40 mg daily. Check lipid panel in 3 months per PCP plan. Order stress test to assess exercise tolerance and EKG changes.  Hyperlipidemia   Managed with atorvastatin, with a recent dosage increase to further reduce LDL to target goals. Encourage maintaining a healthy lifestyle, including regular exercise, to support lipid management. Continue atorvastatin 40 mg daily. Encourage regular exercise and a healthy diet.  Elevated blood pressure   Blood pressure readings were elevated at 140/94 mmHg and 138/80 mmHg, suggesting possible white coat hypertension or situational anxiety.  Recommend weight loss and regular home blood pressure monitoring to assess for persistent hypertension. Discuss the importance of monitoring due to family history of coronary artery disease. Monitor blood pressure at home. Encourage weight loss through regular exercise. Consider purchasing a home blood pressure cuff for regular monitoring.  Will also evaluate her blood pressure during stress testing.  Family history of coronary artery disease   Her husband has coronary artery disease and had a myocardial infarction at age 18, increasing her awareness and concern for cardiovascular health. Advise monitoring cardiovascular risk factors closely. Encourage regular cardiovascular check-ups for her husband. Discuss family history implications.          Signed,  Yates Decamp, MD, [facility] 11/25/2023, 9:53 PM [facility] [address] [place], [place] [address] Phone: [phone]. Fax:  [phone]

## 2023-11-25 NOTE — Patient Instructions (Signed)
 Medication Instructions:  Your physician recommends that you continue on your current medications as directed. Please refer to the Current Medication list given to you today.  *If you need a refill on your cardiac medications before your next appointment, please call your pharmacy*   Lab Work: none If you have labs (blood work) drawn today and your tests are completely normal, you will receive your results only by: MyChart Message (if you have MyChart) OR A paper copy in the mail If you have any lab test that is abnormal or we need to change your treatment, we will call you to review the results.   Testing/Procedures: Your physician has requested that you have an exercise tolerance test. For further information please visit [email or web address] Please also follow instruction sheet, as given.    Follow-Up: At [facility], you and your health needs are our priority.  As part of our continuing mission to provide you with exceptional heart care, we have created designated Provider Care Teams.  These Care Teams include your primary Cardiologist (physician) and Advanced Practice Providers (APPs -  Physician Assistants and Nurse Practitioners) who all work together to provide you with the care you need, when you need it.  We recommend signing up for the patient portal called "MyChart".  Sign up information is provided on this After Visit Summary.  MyChart is used to connect with patients for Virtual Visits (Telemedicine).  Patients are able to view lab/test results, encounter notes, upcoming appointments, etc.  Non-urgent messages can be sent to your provider as well.   To learn more about what you can do with MyChart, go to [email or web address].    Your next appointment:   As needed  Provider:   Yates Decamp, MD     Other Instructions   Please arrive 15 minutes prior to your appointment time to allow for registration and insurance purposes.  The test will take approximately 45  minutes to complete.  How to prepare for your Exercise Stress Test: - Do bring a list of your current medications with you. If you do not take any of the medications listed below, you may take your medications as normal the day of the test. You may take all your normal medications - DO wear comfortable clothes (no dresses or overalls) and walking shoes, tennis shoes preferred (no heels or open toed shoes allowed).

## 2023-12-03 ENCOUNTER — Encounter: Payer: Self-pay | Admitting: Cardiology

## 2023-12-03 ENCOUNTER — Ambulatory Visit: Attending: Cardiology

## 2023-12-03 DIAGNOSIS — R030 Elevated blood-pressure reading, without diagnosis of hypertension: Secondary | ICD-10-CM

## 2023-12-03 DIAGNOSIS — R931 Abnormal findings on diagnostic imaging of heart and coronary circulation: Secondary | ICD-10-CM | POA: Diagnosis not present

## 2023-12-03 DIAGNOSIS — E7800 Pure hypercholesterolemia, unspecified: Secondary | ICD-10-CM | POA: Diagnosis not present

## 2023-12-03 LAB — EXERCISE TOLERANCE TEST
Angina Index: 0
Base ST Depression (mm): 0 mm
Duke Treadmill Score: 5
Estimated workload: 7
Exercise duration (min): 5 min
Exercise duration (sec): 0 s
MPHR: 154 {beats}/min
Peak HR: 151 {beats}/min
Percent HR: 98 %
RPE: 15
Rest HR: 96 {beats}/min
ST Depression (mm): 0 mm

## 2023-12-03 NOTE — Progress Notes (Signed)
 Normal stress test. Reduced exercise capacity with elevated heart rate at low level activity and can be improved with exercise

## 2024-02-26 DIAGNOSIS — E7800 Pure hypercholesterolemia, unspecified: Secondary | ICD-10-CM | POA: Diagnosis not present

## 2024-03-01 DIAGNOSIS — I2510 Atherosclerotic heart disease of native coronary artery without angina pectoris: Secondary | ICD-10-CM | POA: Diagnosis not present

## 2024-03-15 DIAGNOSIS — I2510 Atherosclerotic heart disease of native coronary artery without angina pectoris: Secondary | ICD-10-CM | POA: Diagnosis not present

## 2024-03-15 DIAGNOSIS — E7800 Pure hypercholesterolemia, unspecified: Secondary | ICD-10-CM | POA: Diagnosis not present

## 2024-03-15 DIAGNOSIS — E66811 Obesity, class 1: Secondary | ICD-10-CM | POA: Diagnosis not present

## 2024-03-31 ENCOUNTER — Encounter (HOSPITAL_BASED_OUTPATIENT_CLINIC_OR_DEPARTMENT_OTHER): Payer: Self-pay | Admitting: Emergency Medicine

## 2024-03-31 ENCOUNTER — Emergency Department (HOSPITAL_BASED_OUTPATIENT_CLINIC_OR_DEPARTMENT_OTHER)
Admission: EM | Admit: 2024-03-31 | Discharge: 2024-03-31 | Disposition: A | Discharge status: Home / Self Care | Attending: Emergency Medicine | Admitting: Emergency Medicine

## 2024-03-31 ENCOUNTER — Other Ambulatory Visit: Payer: Self-pay

## 2024-03-31 ENCOUNTER — Emergency Department (HOSPITAL_BASED_OUTPATIENT_CLINIC_OR_DEPARTMENT_OTHER)

## 2024-03-31 DIAGNOSIS — Z7982 Long term (current) use of aspirin: Secondary | ICD-10-CM | POA: Diagnosis not present

## 2024-03-31 DIAGNOSIS — K921 Melena: Secondary | ICD-10-CM | POA: Insufficient documentation

## 2024-03-31 DIAGNOSIS — M25551 Pain in right hip: Secondary | ICD-10-CM | POA: Insufficient documentation

## 2024-03-31 DIAGNOSIS — R1031 Right lower quadrant pain: Secondary | ICD-10-CM | POA: Diagnosis not present

## 2024-03-31 DIAGNOSIS — Z860101 Personal history of adenomatous and serrated colon polyps: Secondary | ICD-10-CM | POA: Diagnosis not present

## 2024-03-31 LAB — COMPREHENSIVE METABOLIC PANEL WITH GFR
ALT: 14 U/L (ref 0–44)
AST: 22 U/L (ref 15–41)
Albumin: 4.6 g/dL (ref 3.5–5.0)
Alkaline Phosphatase: 115 U/L (ref 38–126)
Anion gap: 12 (ref 5–15)
BUN: 17 mg/dL (ref 8–23)
CO2: 23 mmol/L (ref 22–32)
Calcium: 9.5 mg/dL (ref 8.9–10.3)
Chloride: 103 mmol/L (ref 98–111)
Creatinine, Ser: 0.75 mg/dL (ref 0.44–1.00)
GFR, Estimated: >60 mL/min (ref >60)
Glucose, Bld: 94 mg/dL (ref 70–99)
Potassium: 4.1 mmol/L (ref 3.5–5.1)
Sodium: 139 mmol/L (ref 135–145)
Total Bilirubin: 0.5 mg/dL (ref 0.0–1.2)
Total Protein: 7.6 g/dL (ref 6.5–8.1)

## 2024-03-31 LAB — URINALYSIS, ROUTINE W REFLEX MICROSCOPIC
Bilirubin Urine: NEGATIVE
Glucose, UA: NEGATIVE mg/dL
Ketones, ur: NEGATIVE mg/dL
Leukocytes,Ua: NEGATIVE
Nitrite: NEGATIVE
Protein, ur: NEGATIVE mg/dL
Specific Gravity, Urine: 1.015 (ref 1.005–1.030)
pH: 7 (ref 5.0–8.0)

## 2024-03-31 LAB — CBC WITH DIFFERENTIAL/PLATELET
Abs Immature Granulocytes: 0.04 K/uL (ref 0.00–0.07)
Basophils Absolute: 0.1 K/uL (ref 0.0–0.1)
Basophils Relative: 1 %
Eosinophils Absolute: 0.1 K/uL (ref 0.0–0.5)
Eosinophils Relative: 1 %
HCT: 37.4 % (ref 36.0–46.0)
Hemoglobin: 12.4 g/dL (ref 12.0–15.0)
Immature Granulocytes: 0 %
Lymphocytes Relative: 24 %
Lymphs Abs: 2.7 K/uL (ref 0.7–4.0)
MCH: 29.5 pg (ref 26.0–34.0)
MCHC: 33.2 g/dL (ref 30.0–36.0)
MCV: 89 fL (ref 80.0–100.0)
Monocytes Absolute: 0.8 K/uL (ref 0.1–1.0)
Monocytes Relative: 7 %
Neutro Abs: 7.5 K/uL (ref 1.7–7.7)
Neutrophils Relative %: 67 %
Platelets: 388 K/uL (ref 150–400)
RBC: 4.2 MIL/uL (ref 3.87–5.11)
RDW: 13.7 % (ref 11.5–15.5)
WBC: 11.1 K/uL — ABNORMAL HIGH (ref 4.0–10.5)
nRBC: 0 % (ref 0.0–0.2)

## 2024-03-31 LAB — URINALYSIS, MICROSCOPIC (REFLEX)

## 2024-03-31 LAB — LIPASE, BLOOD: Lipase: 32 U/L (ref 11–51)

## 2024-03-31 LAB — OCCULT BLOOD X 1 CARD TO LAB, STOOL: Fecal Occult Bld: POSITIVE — AB

## 2024-03-31 MED ORDER — IOHEXOL 300 MG/ML  SOLN
100.0000 mL | Freq: Once | INTRAMUSCULAR | Status: AC | PRN
  Administered 2024-03-31: 100 mL via INTRAVENOUS

## 2024-03-31 MED ORDER — LIDOCAINE 5 % EX PTCH
1.0000 | MEDICATED_PATCH | CUTANEOUS | Status: DC
  Administered 2024-03-31: 1 via TRANSDERMAL
  Filled 2024-03-31: qty 1

## 2024-03-31 MED ORDER — OXYCODONE HCL 5 MG PO TABS: 5.0000 mg | ORAL_TABLET | ORAL | 0 refills | Status: AC | PRN

## 2024-03-31 NOTE — ED Provider Notes (Signed)
 [place] [facility] Provider Note   CSN: [id number] Arrival date & time: 03/31/24  1643     Patient presents with: Hip Pain and Rectal Bleeding   Sarah Gray is a 68 y.o. female.   68 year old female presents with complaint of concern for blood in her stool and right hip pain.  Patient states that she has had pain in her right posterior posterior lateral hip for the past month, radiates to right groin area.  Pain is worse with transitioning from sitting to standing.  Patient went to PCP who did x-ray lumbar spine and right hip and was told she had arthritis.  She tried Flexeril and meloxicam x 10 days without improvement in her pain.  Denies falls or injury or other trauma.  Patient is concerned about the pain in her right hip area as she is now having changes in her stool concerning for blood.  Stools described as dark and tarry in appearance.  She takes a daily baby aspirin, denies any abdominal pain.  History of 2 mm colon polyp on colonoscopy 3 years ago, advised she would need repeat colonoscopy in 7 years.  She is concerned that there is something else going on inside her abdomen to give her the dark stools and the pain in her hip.  She is scheduled to see GI in 2 weeks.       Prior to Admission medications   Medication Sig Start Date End Date Taking? Authorizing Provider  oxyCODONE  (ROXICODONE ) 5 MG immediate release tablet Take 1 tablet (5 mg total) by mouth every 4 (four) hours as needed for severe pain (pain score 7-10). 03/31/24  Yes Beverley Leita DELENA, PA-C  aspirin EC 81 MG tablet Take 81 mg by mouth daily. Swallow whole.    [provider]  atorvastatin (LIPITOR) 40 MG tablet Take 40 mg by mouth daily. 09/04/23   [provider]  Multiple Vitamin (MULTIVITAMIN) capsule Take 1 capsule by mouth daily.    [provider]  vitamin D3 (CHOLECALCIFEROL) 25 MCG tablet Take 1,000 Units by mouth daily.    [provider]    Allergies: Decongestant [pseudoephedrine hcl] and Zyrtec [cetirizine]    Review of Systems Negative except as per HPI Updated Vital Signs BP 117/82   Pulse 87   Temp 97.7 F (36.5 C) (Oral)   Resp 15   Ht 5' 1 (1.549 m)   Wt 77.1 kg   SpO2 98%   BMI 32.12 kg/m   Physical Exam Vitals and nursing note reviewed. Exam conducted with a chaperone present.  Constitutional:      General: She is not in acute distress.    Appearance: She is well-developed. She is not diaphoretic.  HENT:     Head: Normocephalic and atraumatic.  Pulmonary:     Effort: Pulmonary effort is normal.  Abdominal:     Palpations: Abdomen is soft.     Tenderness: There is no abdominal tenderness.  Genitourinary:    Rectum: Guaiac result positive.  Musculoskeletal:        General: Tenderness present.       Legs:     Comments: Pain in right posterior hip with flexion/external rotation of the right hip  Skin:    General: Skin is warm and dry.     Findings: No erythema or rash.  Neurological:     Mental Status: She is alert and oriented to person, place, and time.  Psychiatric:  Behavior: Behavior normal.     (all labs ordered are listed, but only abnormal results are displayed) Labs Reviewed  CBC WITH DIFFERENTIAL/PLATELET - Abnormal; Notable for the following components:      Result Value   WBC 11.1 (*)    All other components within normal limits  URINALYSIS, ROUTINE W REFLEX MICROSCOPIC - Abnormal; Notable for the following components:   Hgb urine dipstick TRACE (*)    All other components within normal limits  OCCULT BLOOD X 1 CARD TO LAB, STOOL - Abnormal; Notable for the following components:   Fecal Occult Bld POSITIVE (*)    All other components within normal limits  URINALYSIS, MICROSCOPIC (REFLEX) - Abnormal; Notable for the following components:   Bacteria, UA RARE (*)    All other components within normal limits  COMPREHENSIVE METABOLIC PANEL WITH GFR   LIPASE, BLOOD    EKG: None  Radiology: CT ABDOMEN PELVIS W CONTRAST Result Date: 03/31/2024 EXAM: CT ABDOMEN AND PELVIS WITH CONTRAST 03/31/2024 92:71:77 PM TECHNIQUE: CT of the abdomen and pelvis was performed with the administration of intravenous contrast. Multiplanar reformatted images are provided for review. Automated exposure control, iterative reconstruction, and/or weight based adjustment of the mA/kV was utilized to reduce the radiation dose to as low as reasonably achievable. COMPARISON: None available. CLINICAL HISTORY: Lower GI bleed; right hip pain, melena, hx colon polyp. Pt reports RT lower back and hip pain x months, worse now, radiates down RLE; also reports rectal bleeding with every bowel movement x 2 wks; denies rectal pain FINDINGS: LOWER CHEST: No acute abnormality. LIVER: Hepatic steatosis. GALLBLADDER AND BILE DUCTS: Gallbladder is unremarkable. No biliary ductal dilatation. SPLEEN: No acute abnormality. PANCREAS: No acute abnormality. ADRENAL GLANDS: No acute abnormality. KIDNEYS, URETERS AND BLADDER: No stones in the kidneys or ureters. No hydronephrosis. No perinephric or periureteral stranding. Urinary bladder is unremarkable. Scarring in the right kidney. GI AND BOWEL: Stomach demonstrates no acute abnormality. There is no bowel obstruction. No bowel wall thickening. Normal appendix. PERITONEUM AND RETROPERITONEUM: No ascites. No free air. VASCULATURE: Aorta is normal in caliber. LYMPH NODES: No lymphadenopathy. REPRODUCTIVE ORGANS: Hysterectomy. BONES AND SOFT TISSUES: No acute osseous abnormality. No focal soft tissue abnormality. IMPRESSION: 1. No acute findings in the abdomen or pelvis. 2. Hepatic steatosis Electronically signed by: Norman Gatlin MD 03/31/2024 07:43 PM EDT RP Workstation: [id number]     Procedures   Medications Ordered in the ED  lidocaine  (LIDODERM ) 5 % 1 patch (1 patch Transdermal Patch Applied 03/31/24 2011)  iohexol  (OMNIPAQUE ) 300 MG/ML  solution 100 mL (100 mLs Intravenous Contrast Given 03/31/24 1916)                                    Medical Decision Making Amount and/or Complexity of Data Reviewed Labs: ordered. Radiology: ordered.  Risk Prescription drug management.   This patient presents to the ED for concern of right hip pain, dark stools concerning for blood, this involves an extensive number of treatment options, and is a complaint that carries with it a high risk of complications and morbidity.  The differential diagnosis includes but not limited to arthritis in the hip, musculoskeletal pain, upper GI bleed, mass   Co morbidities / Chronic conditions that complicate the patient evaluation  Hyperlipidemia, colon adenoma, osteopenia in the hips   Additional history obtained:  Additional history obtained from EMR External records from outside source obtained and reviewed including  prior labs and imaging on file   Lab Tests:  I Ordered, and personally interpreted labs.  The pertinent results include: CBC without significant findings, specifically hemoglobin hematocrit within notes.  CMP without severe findings.  Lipase normal.  Urinalysis with trace hemoglobin, rare bacteria.  Hemoccult is positive.   Imaging Studies ordered:  I ordered imaging studies including CT abdomen pelvis I independently visualized and interpreted imaging which showed no acute process I agree with the radiologist interpretation    Problem List / ED Course / Critical interventions / Medication management  68 year old female presents with complaint of pain in her right posterior to lateral hip area, seen previously with imaging and reports degenerative changes on x-ray.  Patient took prescribed muscle relaxer and meloxicam without improvement in her hip pain and discontinued these medications after their 10-day course.  She has since had persistent pain, worse when she transitions from sitting to standing.  On exam, this pain  is worse with hip flexion and external rotation.  Suspect musculoskeletal cause however patient was concerned that she is now having dark stools and is concerned these are related.  Her hemoglobin is positive, her stool is melenotic. CT without acute findings, advised this will be better evaluated on colonoscopy with GI. Her H&H are WNL. Recommend follow up with GI. See ortho for her hip pain. Return to ER for any worsening or concerning symptoms. Stop her ASA, avoid NSAIDs due to upper GI bleed. Provided with lidoderm  and rx for oxydocone for pain not controlled with tylenol.  I ordered medication including lidocaine  patch for hip pain Reevaluation of the patient after these medicines showed that the patient discharged in stable condition I have reviewed the patients home medicines and have made adjustments as needed   Social Determinants of Health:  Has PCP, scheduled to see GI in 2 weeks   Test / Admission - Considered:  Stable for discharge      Final diagnoses:  Melena  Right hip pain    ED Discharge Orders          Ordered    oxyCODONE  (ROXICODONE ) 5 MG immediate release tablet  Every 4 hours PRN        03/31/24 2006               Beverley Leita LABOR, PA-C 03/31/24 2318    Lenor Hollering, MD 03/31/24 2330

## 2024-03-31 NOTE — Discharge Instructions (Addendum)
 Follow up with your GI provider as scheduled for the blood in your stool. Recheck with your primary care. Follow up with orthopedics for further evaluation of your hip pain.   STOP your Aspirin, avoid NSAIDs.  Prescription for oxycodone  sent to your pharmacy for hip pain not controlled with lidocaine  patches and tylenol. DO NOT drive if taking oxycodone . This medication can cause constipation.

## 2024-03-31 NOTE — ED Triage Notes (Signed)
 Pt reports RT lower back and hip pain x months, worse now, radiates down RLE; also reports rectal bleeding with every bowel movement x 2 wks; denies rectal pain

## 2024-03-31 NOTE — ED Notes (Signed)
 AVS provided by edp was reviewed with pt. Pt verbalized understanding with no additional questions at this time. Pt was able to verify pharmacy. Pt going home with s/o at bedside

## 2024-04-01 DIAGNOSIS — I2510 Atherosclerotic heart disease of native coronary artery without angina pectoris: Secondary | ICD-10-CM | POA: Diagnosis not present

## 2024-04-07 DIAGNOSIS — R195 Other fecal abnormalities: Secondary | ICD-10-CM | POA: Diagnosis not present

## 2024-04-08 DIAGNOSIS — K5730 Diverticulosis of large intestine without perforation or abscess without bleeding: Secondary | ICD-10-CM | POA: Diagnosis not present

## 2024-04-08 DIAGNOSIS — K648 Other hemorrhoids: Secondary | ICD-10-CM | POA: Diagnosis not present

## 2024-04-08 DIAGNOSIS — K625 Hemorrhage of anus and rectum: Secondary | ICD-10-CM | POA: Diagnosis not present

## 2024-04-08 DIAGNOSIS — D120 Benign neoplasm of cecum: Secondary | ICD-10-CM | POA: Diagnosis not present

## 2024-04-08 DIAGNOSIS — K635 Polyp of colon: Secondary | ICD-10-CM | POA: Diagnosis not present

## 2024-04-15 DIAGNOSIS — E66811 Obesity, class 1: Secondary | ICD-10-CM | POA: Diagnosis not present

## 2024-04-15 DIAGNOSIS — E7800 Pure hypercholesterolemia, unspecified: Secondary | ICD-10-CM | POA: Diagnosis not present

## 2024-04-15 DIAGNOSIS — I2510 Atherosclerotic heart disease of native coronary artery without angina pectoris: Secondary | ICD-10-CM | POA: Diagnosis not present

## 2024-05-16 DIAGNOSIS — I2510 Atherosclerotic heart disease of native coronary artery without angina pectoris: Secondary | ICD-10-CM | POA: Diagnosis not present

## 2024-05-16 DIAGNOSIS — E66811 Obesity, class 1: Secondary | ICD-10-CM | POA: Diagnosis not present

## 2024-05-16 DIAGNOSIS — E7800 Pure hypercholesterolemia, unspecified: Secondary | ICD-10-CM | POA: Diagnosis not present

## 2024-06-05 DIAGNOSIS — I2510 Atherosclerotic heart disease of native coronary artery without angina pectoris: Secondary | ICD-10-CM | POA: Diagnosis not present

## 2024-06-15 DIAGNOSIS — E7800 Pure hypercholesterolemia, unspecified: Secondary | ICD-10-CM | POA: Diagnosis not present

## 2024-06-15 DIAGNOSIS — I2510 Atherosclerotic heart disease of native coronary artery without angina pectoris: Secondary | ICD-10-CM | POA: Diagnosis not present

## 2024-06-15 DIAGNOSIS — E66811 Obesity, class 1: Secondary | ICD-10-CM | POA: Diagnosis not present

## 2024-06-23 DIAGNOSIS — L821 Other seborrheic keratosis: Secondary | ICD-10-CM | POA: Diagnosis not present

## 2024-06-23 DIAGNOSIS — L814 Other melanin hyperpigmentation: Secondary | ICD-10-CM | POA: Diagnosis not present

## 2024-06-23 DIAGNOSIS — D225 Melanocytic nevi of trunk: Secondary | ICD-10-CM | POA: Diagnosis not present

## 2024-06-23 DIAGNOSIS — L820 Inflamed seborrheic keratosis: Secondary | ICD-10-CM | POA: Diagnosis not present

## 2024-06-23 DIAGNOSIS — L578 Other skin changes due to chronic exposure to nonionizing radiation: Secondary | ICD-10-CM | POA: Diagnosis not present

## 2024-06-23 DIAGNOSIS — M7130 Other bursal cyst, unspecified site: Secondary | ICD-10-CM | POA: Diagnosis not present

## 2024-07-16 DIAGNOSIS — I2510 Atherosclerotic heart disease of native coronary artery without angina pectoris: Secondary | ICD-10-CM | POA: Diagnosis not present

## 2024-07-16 DIAGNOSIS — E66811 Obesity, class 1: Secondary | ICD-10-CM | POA: Diagnosis not present

## 2024-07-16 DIAGNOSIS — E7800 Pure hypercholesterolemia, unspecified: Secondary | ICD-10-CM | POA: Diagnosis not present

## 2024-08-09 ENCOUNTER — Other Ambulatory Visit: Payer: Self-pay | Admitting: Family Medicine

## 2024-08-09 DIAGNOSIS — Z1231 Encounter for screening mammogram for malignant neoplasm of breast: Secondary | ICD-10-CM

## 2024-08-15 DIAGNOSIS — I2510 Atherosclerotic heart disease of native coronary artery without angina pectoris: Secondary | ICD-10-CM | POA: Diagnosis not present

## 2024-08-15 DIAGNOSIS — E66811 Obesity, class 1: Secondary | ICD-10-CM | POA: Diagnosis not present

## 2024-08-15 DIAGNOSIS — E7800 Pure hypercholesterolemia, unspecified: Secondary | ICD-10-CM | POA: Diagnosis not present

## 2024-08-19 DIAGNOSIS — E7800 Pure hypercholesterolemia, unspecified: Secondary | ICD-10-CM | POA: Diagnosis not present

## 2024-08-19 DIAGNOSIS — M85859 Other specified disorders of bone density and structure, unspecified thigh: Secondary | ICD-10-CM | POA: Diagnosis not present

## 2024-08-19 DIAGNOSIS — Z131 Encounter for screening for diabetes mellitus: Secondary | ICD-10-CM | POA: Diagnosis not present

## 2024-08-25 DIAGNOSIS — E7800 Pure hypercholesterolemia, unspecified: Secondary | ICD-10-CM | POA: Diagnosis not present

## 2024-08-25 DIAGNOSIS — I2510 Atherosclerotic heart disease of native coronary artery without angina pectoris: Secondary | ICD-10-CM | POA: Diagnosis not present

## 2024-08-25 DIAGNOSIS — M85859 Other specified disorders of bone density and structure, unspecified thigh: Secondary | ICD-10-CM | POA: Diagnosis not present

## 2024-08-25 DIAGNOSIS — Z6833 Body mass index (BMI) 33.0-33.9, adult: Secondary | ICD-10-CM | POA: Diagnosis not present

## 2024-08-25 DIAGNOSIS — Z01419 Encounter for gynecological examination (general) (routine) without abnormal findings: Secondary | ICD-10-CM | POA: Diagnosis not present

## 2024-08-25 DIAGNOSIS — Z0000 Encounter for general adult medical examination without abnormal findings: Secondary | ICD-10-CM | POA: Diagnosis not present

## 2024-08-25 DIAGNOSIS — Z1331 Encounter for screening for depression: Secondary | ICD-10-CM | POA: Diagnosis not present

## 2024-09-15 ENCOUNTER — Ambulatory Visit
Admission: RE | Admit: 2024-09-15 | Discharge: 2024-09-15 | Disposition: A | Discharge status: Home / Self Care | Source: Ambulatory Visit | Attending: Family Medicine | Admitting: Family Medicine

## 2024-09-15 DIAGNOSIS — Z1231 Encounter for screening mammogram for malignant neoplasm of breast: Secondary | ICD-10-CM
